# Patient Record
Sex: Male | Born: 1985 | Race: White | Hispanic: No | Marital: Single | Smoking: Former smoker
Health system: Southern US, Community
[De-identification: ages and names within clinical notes are randomized; demographics above are authoritative.]

---

## 2013-01-01 ENCOUNTER — Ambulatory Visit: Payer: Self-pay | Admitting: Family Medicine

## 2014-06-29 ENCOUNTER — Ambulatory Visit: Payer: Self-pay | Admitting: Family Medicine

## 2014-12-30 IMAGING — US US PELVIS LIMITED
1 series · 14 of 25 positions shown · non-contrast
Comparison: none

REASON FOR EXAM: R groin pain radiating into right testicle for one week
COMMENTS:

[Series 1: us pelvis limited · 0.08mm/px · 14 of 88 slices shown]
[im 1/88]
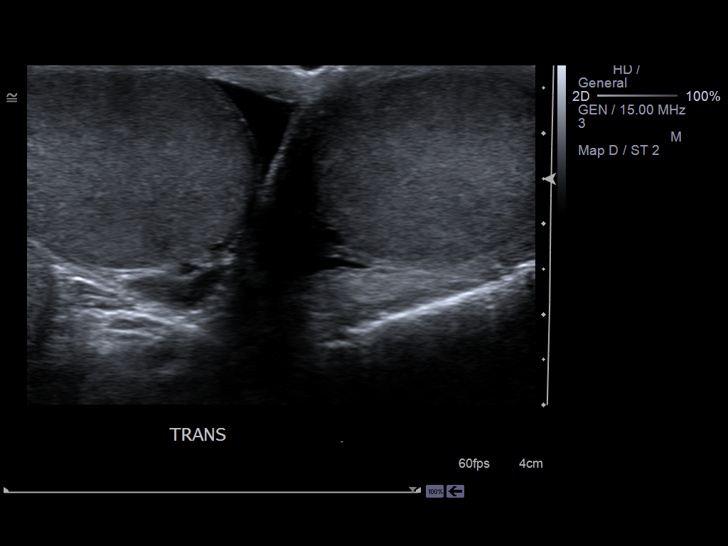
[im 8/88]
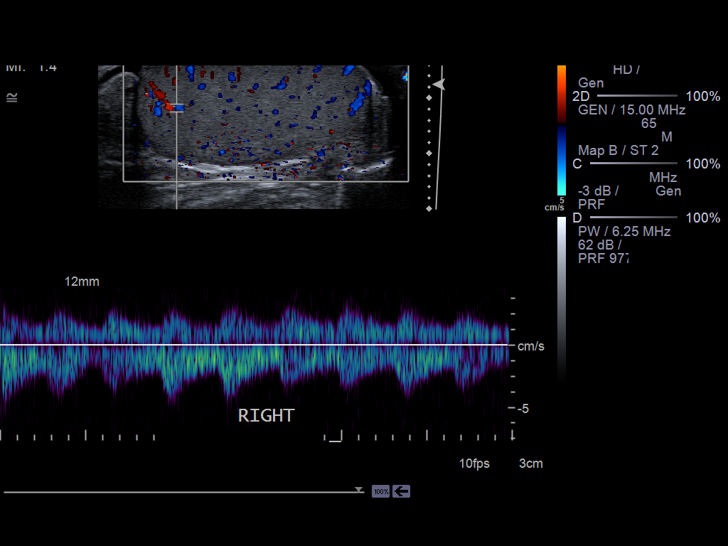
[im 15/88]
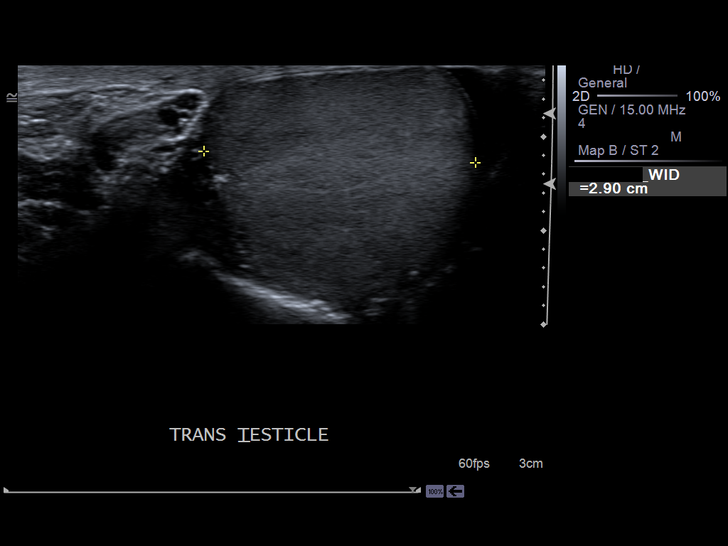
[im 22/88]
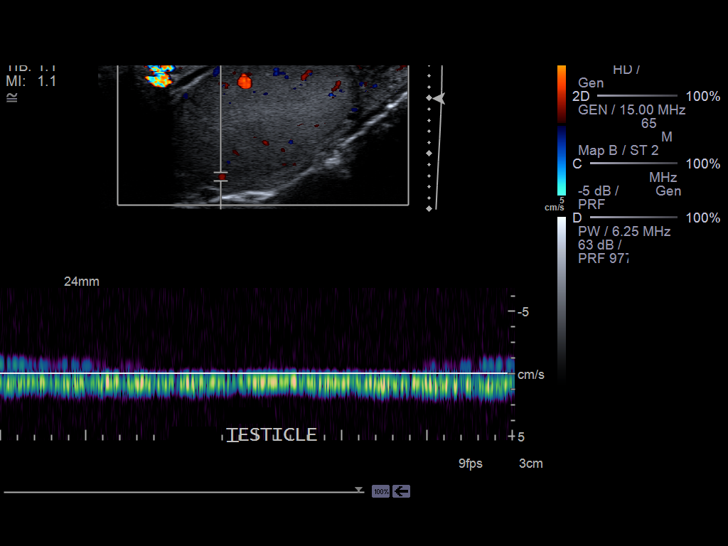
[im 30/88]
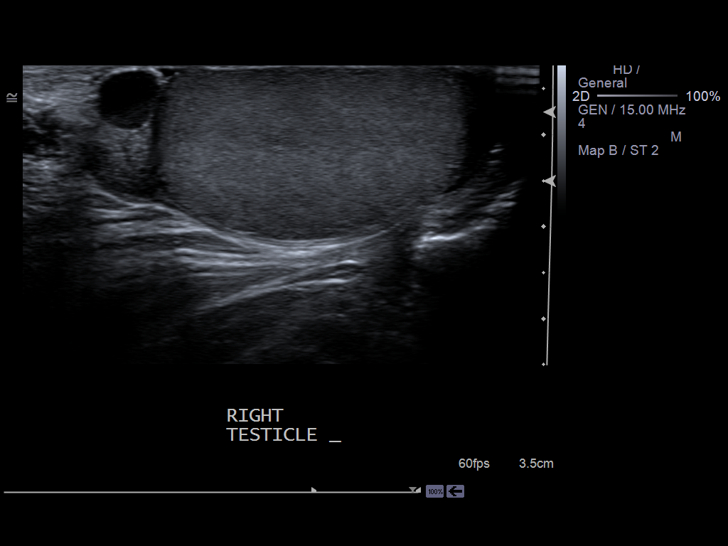
[im 33/88]
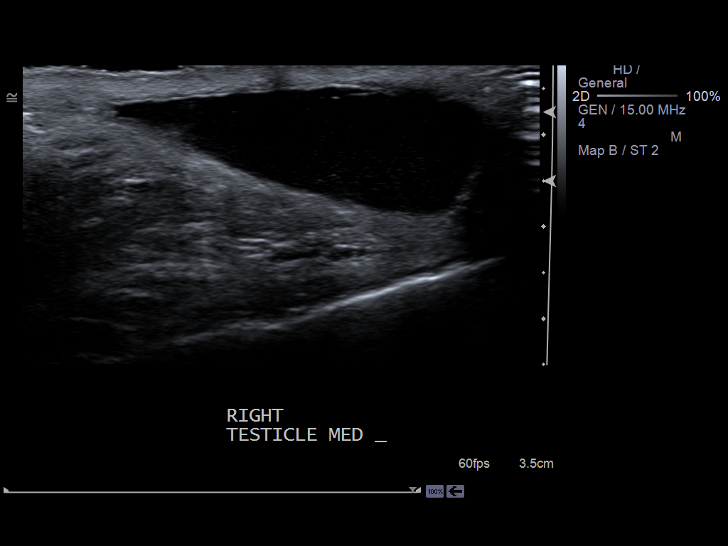
[im 40/88]
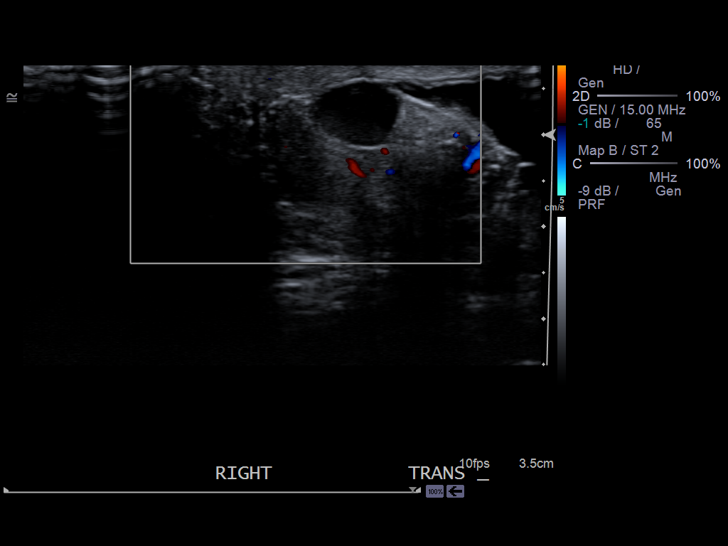
[im 48/88]
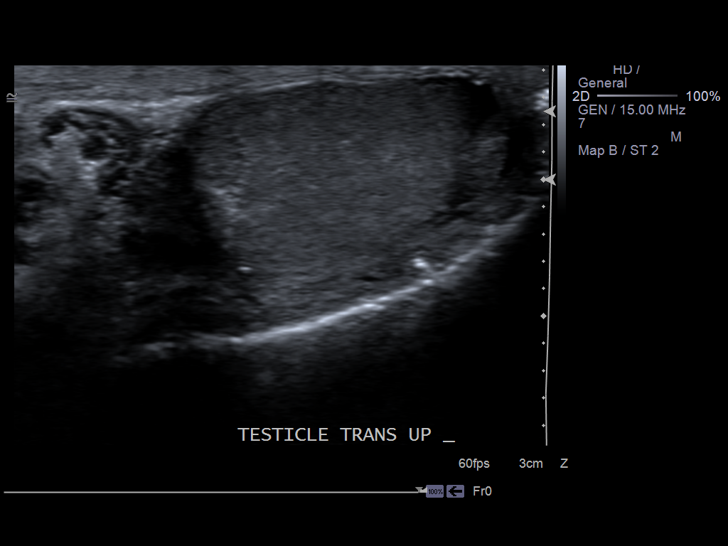
[im 55/88]
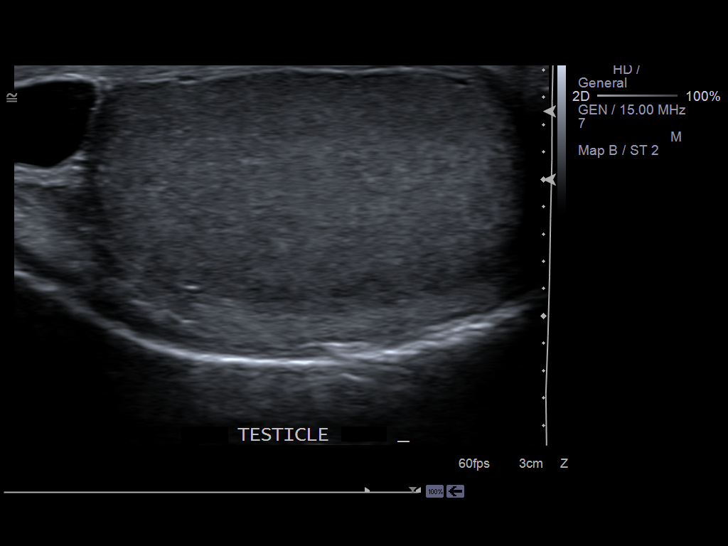
[im 59/88]
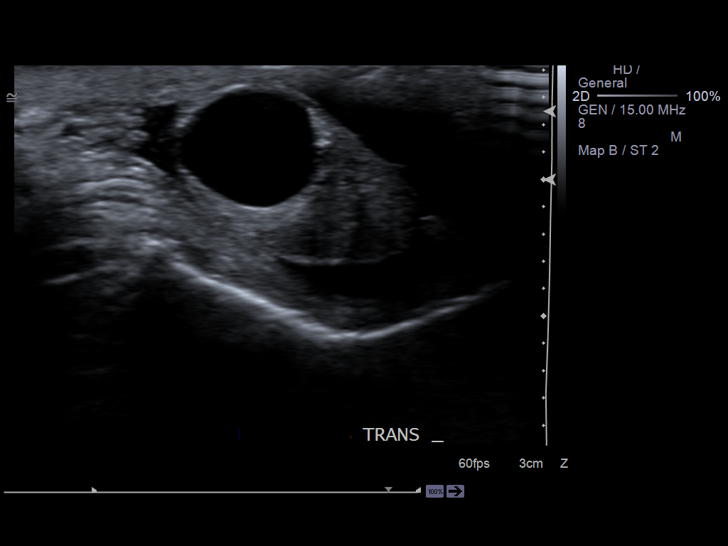
[im 66/88]
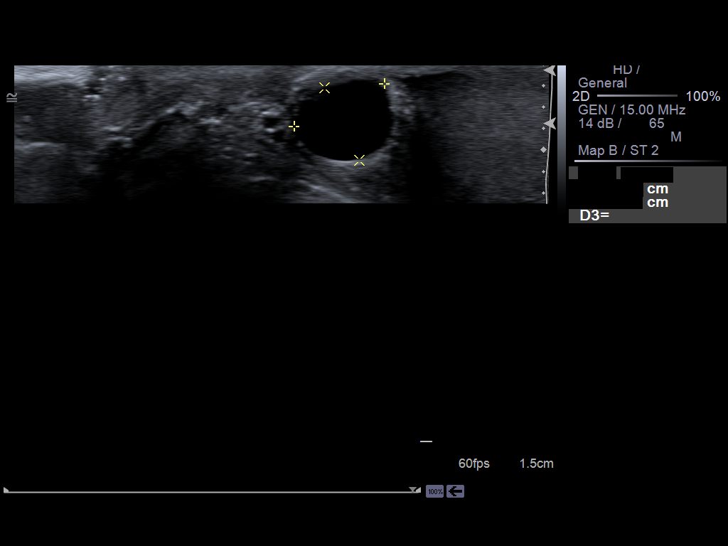
[im 73/88]
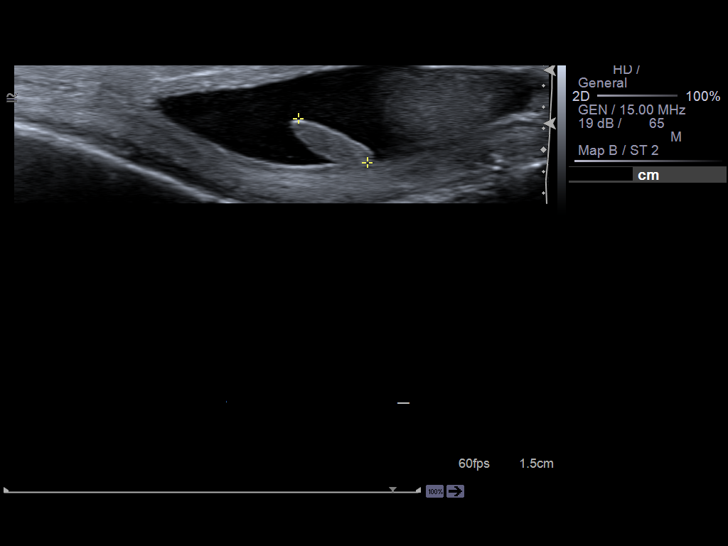
[im 80/88]
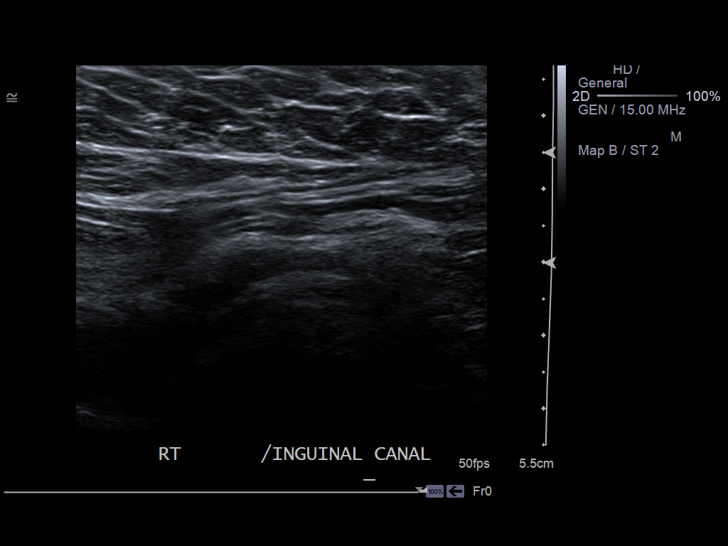
[im 88/88]
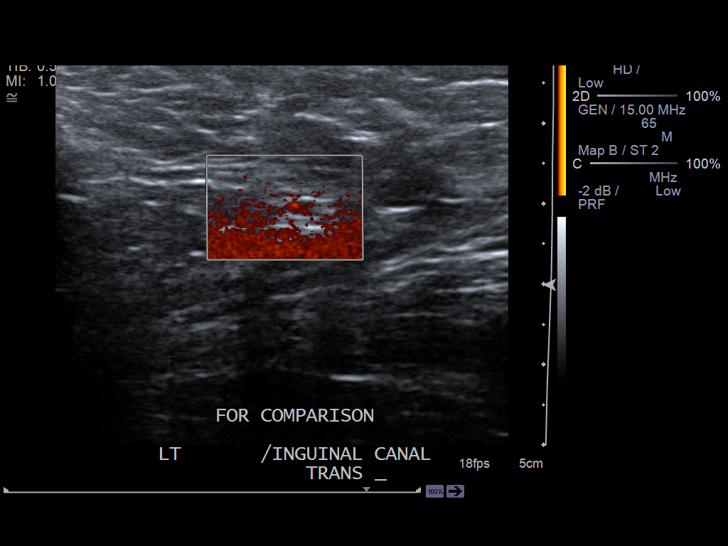

[14 of 25 positions shown; findings below may reference images not displayed]

PROCEDURE:     [facility]  - January 01, 2013  [DATE]

RESULT:     Testicular ultrasound demonstrates the right testicle measures
3.30 x 4.16 x 2.04 cm. The left testicle measures 3.72 x 2.13 x 2.90 cm.
Both testicles demonstrate Doppler evidence of arterial and venous flow.
There is a small cyst in the left epididymis measuring up to 10.6 mm. A
small right epididymal cyst present measuring up to 9.6 mm. Epididymal
structures are otherwise unremarkable. The right epididymal cyst contains
what appears to be some internal debris. Hydroceles are present bilaterally
and appear small.
IMPRESSION: 1. No testicular mass or torsion evident.
2. Small hydroceles present.
3. Bilateral cystic lesions in the epididymal structures with some debris in
the right epididymal head cystic region.

[REDACTED]

## 2021-01-16 ENCOUNTER — Encounter: Payer: Self-pay | Admitting: Emergency Medicine

## 2021-01-16 ENCOUNTER — Ambulatory Visit
Admission: EM | Admit: 2021-01-16 | Discharge: 2021-01-16 | Disposition: A | Discharge status: Home / Self Care | Payer: 59 | Attending: Family Medicine | Admitting: Family Medicine

## 2021-01-16 ENCOUNTER — Other Ambulatory Visit: Payer: Self-pay

## 2021-01-16 DIAGNOSIS — J069 Acute upper respiratory infection, unspecified: Secondary | ICD-10-CM | POA: Diagnosis present

## 2021-01-16 DIAGNOSIS — J029 Acute pharyngitis, unspecified: Secondary | ICD-10-CM | POA: Insufficient documentation

## 2021-01-16 DIAGNOSIS — H9201 Otalgia, right ear: Secondary | ICD-10-CM | POA: Insufficient documentation

## 2021-01-16 LAB — GROUP A STREP BY PCR: Group A Strep by PCR: NOT DETECTED

## 2021-01-16 MED ORDER — FLUTICASONE PROPIONATE 50 MCG/ACT NA SUSP: 2.0000 | Freq: Every day | NASAL | 1 refills | Status: AC

## 2021-01-16 MED ORDER — IPRATROPIUM BROMIDE 0.06 % NA SOLN: 2.0000 | Freq: Four times a day (QID) | NASAL | 12 refills | Status: AC

## 2021-01-16 NOTE — Discharge Instructions (Addendum)
Use the Atrovent nasal spray, 2 squirts in each nostril every 6 hours, as needed for runny nose and postnasal drip.  Use the Flonase at bedtime.  You will instill 2 squirts in each nostril.  Use opposite hand to opposite nare technique as displayed in clinic.  Follow each 2 squirts up each nostril with 1 squirt of nasal saline to push the particles up to where they belong and where they will take effect.  Use over-the-counter Allegra 180 mg daily as an antihistamine to help with sinus congestion and nasal discharge.  Return for reevaluation or see your primary care provider for any new or worsening symptoms.

## 2021-01-16 NOTE — ED Provider Notes (Signed)
[facility]    CSN: [id number] Arrival date & time: 01/16/21  0801      History   Chief Complaint Chief Complaint  Patient presents with  . Sore Throat  . Otalgia    HPI Rodney Galloway is a 35 y.o. male.   HPI   35 year old male here for evaluation of sore throat and right ear issues.  Patient reports that for the last 2 days he has been having what he calls "heaviness" in his right ear as well as some muffled hearing.  He denies any overt pain.  He is also been dealing with a sore throat.  He denies fever, runny nose or nasal congestion, ringing in his ears, dizziness, cough, or shortness of breath.  Patient reports that he does have a longstanding history of sinus issues and has chronic right-sided sinus inflammation.  History reviewed. No pertinent past medical history.  There are no problems to display for this patient.   History reviewed. No pertinent surgical history.     Home Medications    Prior to Admission medications   Medication Sig Start Date End Date Taking? Authorizing Provider  fluticasone (FLONASE) 50 MCG/ACT nasal spray Place 2 sprays into both nostrils daily. 01/16/21  Yes Becky Augusta, NP  ipratropium (ATROVENT) 0.06 % nasal spray Place 2 sprays into both nostrils 4 (four) times daily. 01/16/21  Yes Becky Augusta, NP    Family History History reviewed. No pertinent family history.  Social History Social History   Tobacco Use  . Smoking status: Former Games developer  . Smokeless tobacco: Former Clinical biochemist  . Vaping Use: Never used  Substance Use Topics  . Alcohol use: Yes  . Drug use: Never     Allergies   Cefaclor   Review of Systems Review of Systems  Constitutional: Negative for activity change, appetite change and fever.  HENT: Positive for hearing loss and sore throat. Negative for congestion, ear pain and rhinorrhea.   Respiratory: Negative for cough, shortness of breath and wheezing.   Neurological: Negative for  dizziness.  Hematological: Negative.   Psychiatric/Behavioral: Negative.      Physical Exam Triage Vital Signs ED Triage Vitals  Enc Vitals Group     BP      Pulse      Resp      Temp      Temp src      SpO2      Weight      Height      Head Circumference      Peak Flow      Pain Score      Pain Loc      Pain Edu?      Excl. in GC?    No data found.  Updated Vital Signs BP (!) 144/84 (BP Location: Right Arm)   Pulse 81   Temp 98.3 F (36.8 C) (Oral)   Resp 16   Ht 5\' 8"  (1.727 m)   Wt 205 lb (93 kg)   SpO2 100%   BMI 31.17 kg/m   Visual Acuity Right Eye Distance:   Left Eye Distance:   Bilateral Distance:    Right Eye Near:   Left Eye Near:    Bilateral Near:     Physical Exam Vitals and nursing note reviewed.  Constitutional:      General: He is not in acute distress.    Appearance: He is well-developed and normal weight. He is not ill-appearing.  HENT:  Head: Normocephalic and atraumatic.     Left Ear: Tympanic membrane and ear canal normal.  No middle ear effusion. Tympanic membrane is not erythematous.     Mouth/Throat:     Mouth: Mucous membranes are moist.     Pharynx: Oropharynx is clear. Uvula midline. Posterior oropharyngeal erythema present.     Tonsils: No tonsillar exudate. 1+ on the right. 1+ on the left.  Cardiovascular:     Rate and Rhythm: Normal rate and regular rhythm.     Heart sounds: Normal heart sounds. No murmur heard. No gallop.   Musculoskeletal:     Cervical back: Normal range of motion and neck supple.  Lymphadenopathy:     Cervical: No cervical adenopathy.  Skin:    General: Skin is warm and dry.     Capillary Refill: Capillary refill takes less than 2 seconds.     Findings: No erythema or rash.  Neurological:     General: No focal deficit present.     Mental Status: He is alert and oriented to person, place, and time.  Psychiatric:        Mood and Affect: Mood normal.        Behavior: Behavior normal.       UC Treatments / Results  Labs (all labs ordered are listed, but only abnormal results are displayed) Labs Reviewed  GROUP A STREP BY PCR    EKG   Radiology No results found.  Procedures Procedures (including critical care time)  Medications Ordered in UC Medications - No data to display  Initial Impression / Assessment and Plan / UC Course  I have reviewed the triage vital signs and the nursing notes.  Pertinent labs & imaging results that were available during my care of the patient were reviewed by me and considered in my medical decision making (see chart for details).   Patient is a very pleasant and nontoxic-appearing 35 year old male here for evaluation of sore throat and right ear issues that started 2 days ago.  His symptoms consist of muffled hearing in the right ear with what he terms "heaviness".  He has not had any tinnitus or vertigo symptoms.  No upper respiratory symptoms as he denies runny nose or nasal congestion.  No lower respiratory symptoms and denies cough or shortness of breath.  Patient has not had a fever.  Patient was he has been to the ENT in the past for chronic right-sided sinus inflammation and there was a bit of mildly deviated septum.  Physical exam reveals a pearly gray tympanic membrane on the left with a normal external auditory canal.  Right EAC is slightly elevated up and I am unable to view the bottom third of the tympanic membrane.  The rest of the visible membrane is a dusky yellow.  There is no erythema or drainage in the canal that I can visualize.  Nasal mucosa is erythematous and mildly edematous.  There is significant deviation of the septum on the left-hand side.  Nasal discharge is clear and mild.  Oropharyngeal exam reveals erythematous and edematous tonsillar pillars both at 1+.  There is no exudate visible.  Posterior oropharynx appears unremarkable.  No cervical lymphadenopathy appreciated on exam.  Cardiopulmonary exam is benign.   Strep PCR collected at triage is pending.  Group A strep PCR is negative.     Final Clinical Impressions(s) / UC Diagnoses   Final diagnoses:  Upper respiratory tract infection, unspecified type  Pharyngitis, unspecified etiology  Right ear pain  Discharge Instructions     Use the Atrovent nasal spray, 2 squirts in each nostril every 6 hours, as needed for runny nose and postnasal drip.  Use the Flonase at bedtime.  You will instill 2 squirts in each nostril.  Use opposite hand to opposite nare technique as displayed in clinic.  Follow each 2 squirts up each nostril with 1 squirt of nasal saline to push the particles up to where they belong and where they will take effect.  Use over-the-counter Allegra 180 mg daily as an antihistamine to help with sinus congestion and nasal discharge.  Return for reevaluation or see your primary care provider for any new or worsening symptoms.     ED Prescriptions    Medication Sig Dispense Auth. Provider   fluticasone (FLONASE) 50 MCG/ACT nasal spray Place 2 sprays into both nostrils daily. 18.2 mL Becky Augusta, NP   ipratropium (ATROVENT) 0.06 % nasal spray Place 2 sprays into both nostrils 4 (four) times daily. 15 mL Becky Augusta, NP     PDMP not reviewed this encounter.   Becky Augusta, NP 01/16/21 [phone]

## 2021-01-16 NOTE — ED Triage Notes (Signed)
Patient c/o sore throat and right ear pain that started yesterday.  Patient denies fevers.
